# Patient Record
Sex: Female | Born: 1935 | Race: White | Hispanic: No | Marital: Married | Smoking: Never smoker
Health system: Southern US, Community
[De-identification: ages and names within clinical notes are randomized; demographics above are authoritative.]

## PROBLEM LIST (undated history)

## (undated) DIAGNOSIS — E785 Hyperlipidemia, unspecified: Secondary | ICD-10-CM

## (undated) DIAGNOSIS — I10 Essential (primary) hypertension: Secondary | ICD-10-CM

## (undated) DIAGNOSIS — K219 Gastro-esophageal reflux disease without esophagitis: Secondary | ICD-10-CM

## (undated) DIAGNOSIS — Z8489 Family history of other specified conditions: Secondary | ICD-10-CM

## (undated) HISTORY — PX: CARDIAC ELECTROPHYSIOLOGY MAPPING AND ABLATION: SHX1292

## (undated) HISTORY — PX: BLADDER SURGERY: SHX569

## (undated) HISTORY — PX: ABDOMINAL HYSTERECTOMY: SHX81

---

## 2008-07-27 ENCOUNTER — Ambulatory Visit: Payer: Self-pay | Admitting: Diagnostic Radiology

## 2008-07-27 ENCOUNTER — Emergency Department (HOSPITAL_BASED_OUTPATIENT_CLINIC_OR_DEPARTMENT_OTHER): Admission: EM | Admit: 2008-07-27 | Discharge: 2008-07-27 | Payer: Self-pay | Admitting: Emergency Medicine

## 2010-05-23 IMAGING — CR DG ANKLE COMPLETE 3+V*R*
3 series · 3 of 3 positions shown · non-contrast
Comparison: None

CLINICAL DATA: Fell with pain

RIGHT ANKLE - COMPLETE 3+ VIEW

[t ankle joint ap right]
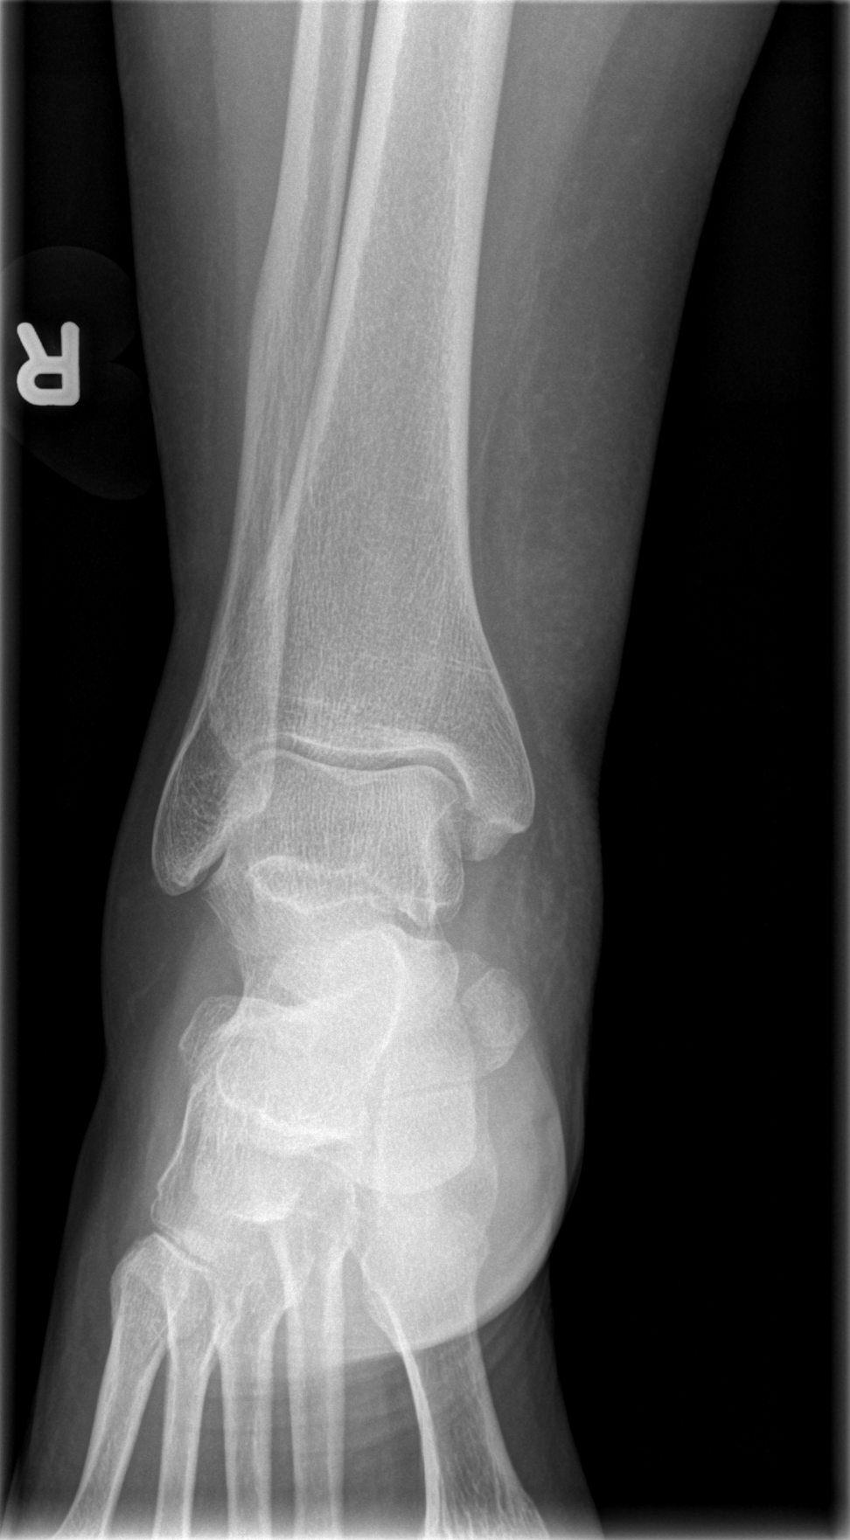

[t ankle joint oblique right]
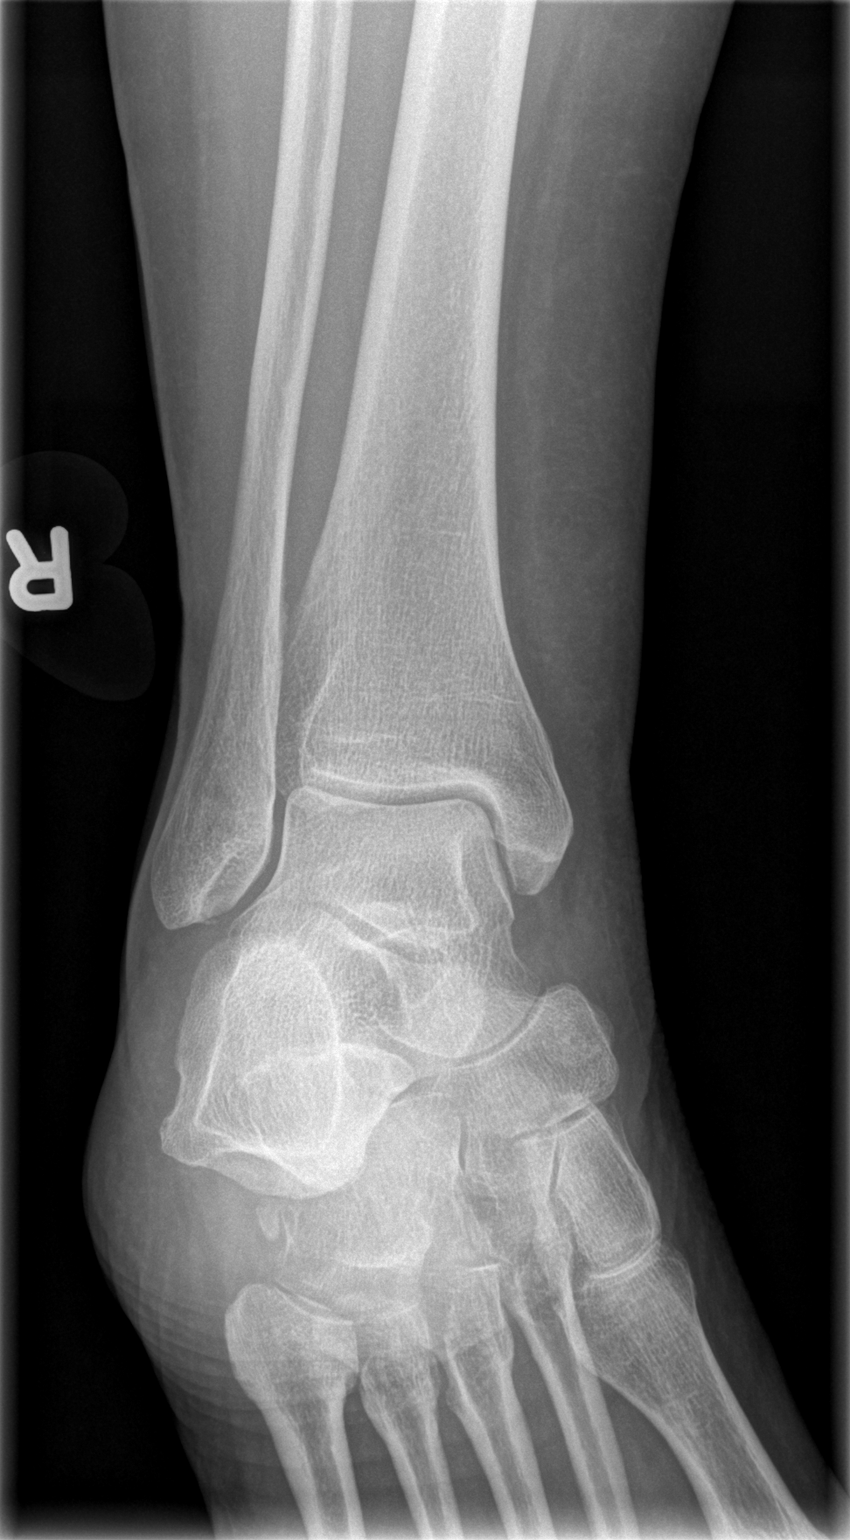

[t ankle joint lat right]
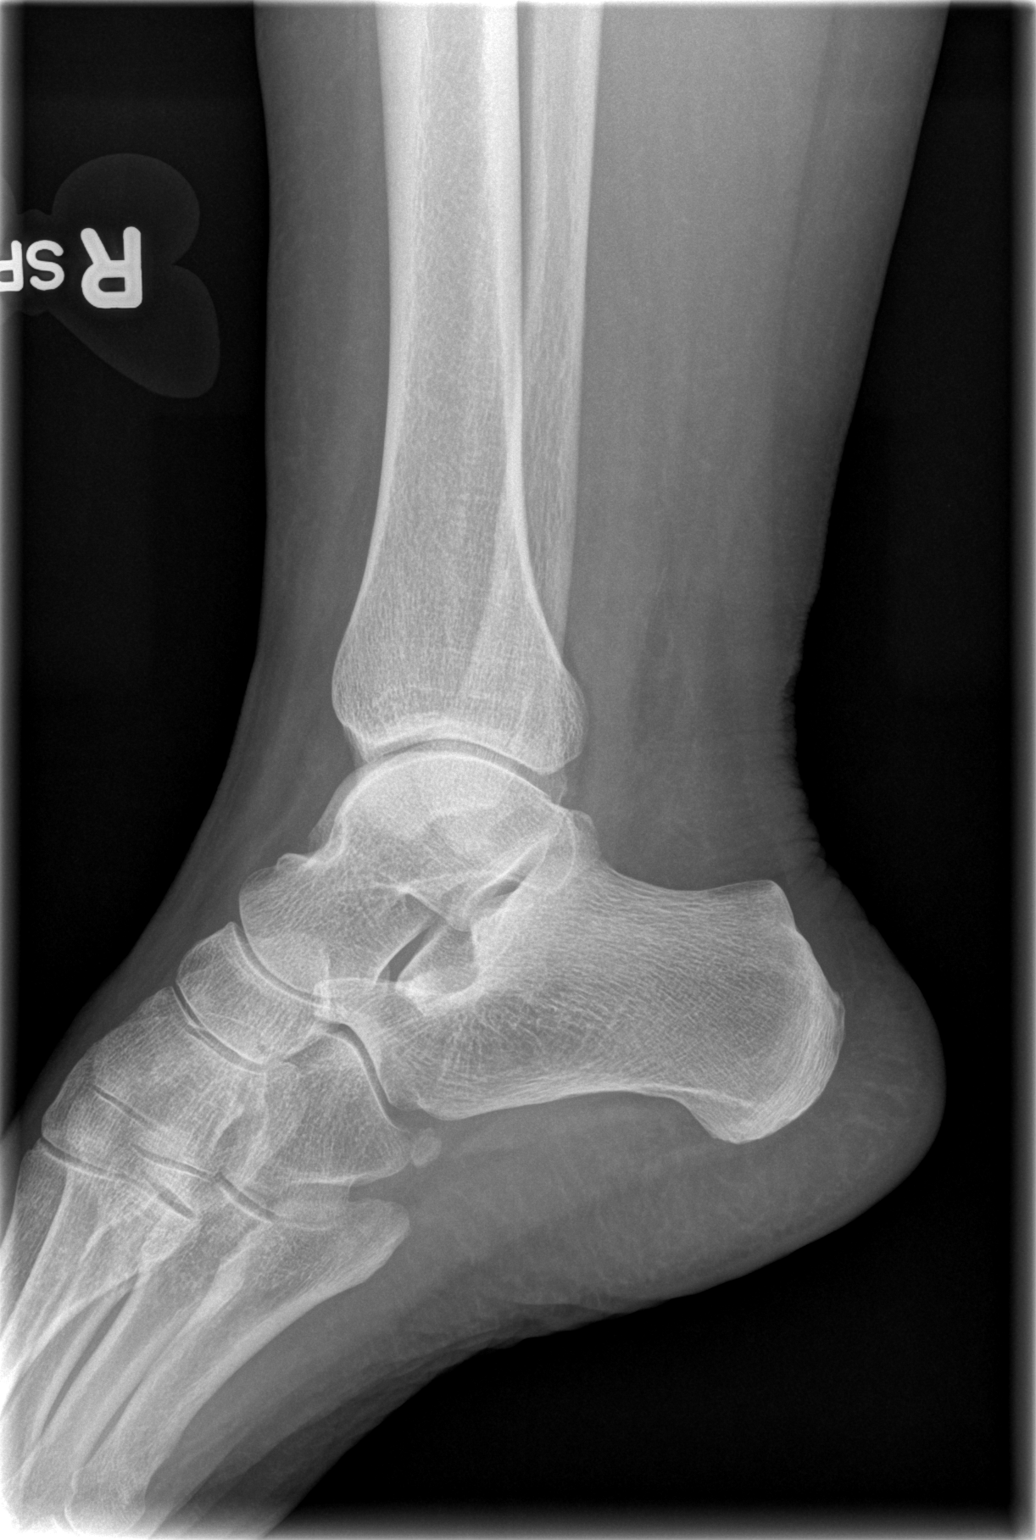

[3 of 3 positions shown; findings below may reference images not displayed]

FINDINGS: No acute fracture is seen.  The ankle joint appears
normal.  Alignment is normal.
IMPRESSION: No acute fracture.

## 2010-07-30 ENCOUNTER — Emergency Department (HOSPITAL_BASED_OUTPATIENT_CLINIC_OR_DEPARTMENT_OTHER)
Admission: EM | Admit: 2010-07-30 | Discharge: 2010-07-30 | Disposition: A | Discharge status: Home / Self Care | Payer: Medicare Other | Attending: Emergency Medicine | Admitting: Emergency Medicine

## 2010-07-30 DIAGNOSIS — I10 Essential (primary) hypertension: Secondary | ICD-10-CM | POA: Insufficient documentation

## 2010-07-30 DIAGNOSIS — E7800 Pure hypercholesterolemia, unspecified: Secondary | ICD-10-CM | POA: Insufficient documentation

## 2010-07-30 LAB — BASIC METABOLIC PANEL
CO2: 27 mEq/L (ref 19–32)
Calcium: 9.9 mg/dL (ref 8.4–10.5)
Creatinine, Ser: 0.9 mg/dL (ref 0.4–1.2)
GFR calc non Af Amer: >60 mL/min (ref >60)
Glucose, Bld: 108 mg/dL — ABNORMAL HIGH (ref 70–99)

## 2011-03-03 ENCOUNTER — Encounter: Payer: Self-pay | Admitting: *Deleted

## 2011-03-03 ENCOUNTER — Emergency Department (HOSPITAL_BASED_OUTPATIENT_CLINIC_OR_DEPARTMENT_OTHER)
Admission: EM | Admit: 2011-03-03 | Discharge: 2011-03-03 | Disposition: A | Discharge status: Home / Self Care | Payer: Medicare Other | Attending: Emergency Medicine | Admitting: Emergency Medicine

## 2011-03-03 DIAGNOSIS — L03319 Cellulitis of trunk, unspecified: Secondary | ICD-10-CM | POA: Insufficient documentation

## 2011-03-03 DIAGNOSIS — E785 Hyperlipidemia, unspecified: Secondary | ICD-10-CM | POA: Insufficient documentation

## 2011-03-03 DIAGNOSIS — L723 Sebaceous cyst: Secondary | ICD-10-CM

## 2011-03-03 DIAGNOSIS — I10 Essential (primary) hypertension: Secondary | ICD-10-CM | POA: Insufficient documentation

## 2011-03-03 DIAGNOSIS — L0291 Cutaneous abscess, unspecified: Secondary | ICD-10-CM

## 2011-03-03 DIAGNOSIS — L02219 Cutaneous abscess of trunk, unspecified: Secondary | ICD-10-CM | POA: Insufficient documentation

## 2011-03-03 HISTORY — DX: Essential (primary) hypertension: I10

## 2011-03-03 HISTORY — DX: Hyperlipidemia, unspecified: E78.5

## 2011-03-03 MED ORDER — LIDOCAINE-EPINEPHRINE 2 %-1:100000 IJ SOLN
20.0000 mL | Freq: Once | INTRAMUSCULAR | Status: AC
  Administered 2011-03-03: 1 mL
  Filled 2011-03-03: qty 1

## 2011-03-03 NOTE — ED Notes (Signed)
Pt c/o abscess in right inguinal area.

## 2011-03-03 NOTE — ED Provider Notes (Signed)
History     CSN: [id number] Arrival date & time: 03/03/2011  3:02 PM   First MD Initiated Contact with Patient 03/03/11 1518      Chief Complaint  Patient presents with  . Abscess    (Consider location/radiation/quality/duration/timing/severity/associated sxs/prior treatment) Patient is a 76 y.o. female presenting with abscess. The history is provided by the patient.  Abscess  This is a new problem. The current episode started yesterday. The onset was gradual. The problem occurs continuously. The problem has been gradually worsening. The abscess is present on the groin. The problem is moderate. The abscess is characterized by redness and painfulness. Associated with: nothing. The abscess first occurred at home. Pertinent negatives include no fever. She has received no recent medical care.    Past Medical History  Diagnosis Date  . Hypertension   . Hyperlipidemia     Past Surgical History  Procedure Date  . Cardiac electrophysiology mapping and ablation   . Abdominal hysterectomy   . Bladder surgery     No family history on file.  History  Substance Use Topics  . Smoking status: Never Smoker   . Smokeless tobacco: Not on file  . Alcohol Use: Yes    OB History    Grav Para Term Preterm Abortions TAB SAB Ect Mult Living                  Review of Systems  Constitutional: Negative for fever.  All other systems reviewed and are negative.    Allergies  Guaifenesin & derivatives  Home Medications  No current outpatient prescriptions on file.  BP 152/67  Pulse 99  Temp(Src) 97.8 F (36.6 C) (Oral)  Resp 18  SpO2 99%  Physical Exam  Nursing note and vitals reviewed. Constitutional: She is oriented to person, place, and time. She appears well-developed and well-nourished. No distress.  HENT:  Head: Normocephalic and atraumatic.  Eyes: EOM are normal. Pupils are equal, round, and reactive to light.  Neck: Neck supple.  Abdominal: Soft.  Musculoskeletal:  She exhibits no tenderness.  Neurological: She is alert and oriented to person, place, and time.  Skin: Skin is warm and dry.       Small abscess in the right groin in the bikini area without surrounding erythema or drainage but pointing and fluctuant.    ED Course  Procedures (including critical care time)  Labs Reviewed - No data to display No results found.  INCISION AND DRAINAGE Performed by: Gwyneth Sprout Consent: Verbal consent obtained. Risks and benefits: risks, benefits and alternatives were discussed Type: abscess  Body area: right groin   Anesthesia: local infiltration  Local anesthetic: lidocaine 2% with epinephrine  Anesthetic total: 2 ml  Complexity: simple   Drainage: purulent  Drainage amount: 1mL   Patient tolerance: Patient tolerated the procedure well with no immediate complications.     No diagnosis found.    MDM   Pt with abscess in the right groin without surrounding infection.  No other signs of abnormalities and not diabetic.  Abscess drained and d/c home.        Gwyneth Sprout, MD 03/03/11 1614

## 2021-08-12 ENCOUNTER — Other Ambulatory Visit: Payer: Self-pay | Admitting: Orthopedic Surgery

## 2021-08-26 ENCOUNTER — Encounter (HOSPITAL_BASED_OUTPATIENT_CLINIC_OR_DEPARTMENT_OTHER): Payer: Self-pay | Admitting: Orthopedic Surgery

## 2021-08-26 ENCOUNTER — Other Ambulatory Visit: Payer: Self-pay

## 2021-09-01 ENCOUNTER — Encounter (HOSPITAL_BASED_OUTPATIENT_CLINIC_OR_DEPARTMENT_OTHER)
Admission: RE | Admit: 2021-09-01 | Discharge: 2021-09-01 | Disposition: A | Discharge status: Home / Self Care | Payer: Medicare Other | Source: Ambulatory Visit | Attending: Orthopedic Surgery | Admitting: Orthopedic Surgery

## 2021-09-01 DIAGNOSIS — Z01818 Encounter for other preprocedural examination: Secondary | ICD-10-CM | POA: Insufficient documentation

## 2021-09-01 DIAGNOSIS — I10 Essential (primary) hypertension: Secondary | ICD-10-CM | POA: Diagnosis not present

## 2021-09-01 LAB — BASIC METABOLIC PANEL
Anion gap: 9 (ref 5–15)
BUN: 13 mg/dL (ref 8–23)
CO2: 25 mmol/L (ref 22–32)
Calcium: 9.4 mg/dL (ref 8.9–10.3)
Chloride: 108 mmol/L (ref 98–111)
Creatinine, Ser: 0.84 mg/dL (ref 0.44–1.00)
GFR, Estimated: >60 mL/min (ref >60)
Glucose, Bld: 100 mg/dL — ABNORMAL HIGH (ref 70–99)
Potassium: 3.8 mmol/L (ref 3.5–5.1)
Sodium: 142 mmol/L (ref 135–145)

## 2021-09-01 NOTE — Progress Notes (Signed)

## 2021-09-04 NOTE — Anesthesia Preprocedure Evaluation (Addendum)
Anesthesia Evaluation  ?Patient identified by MRN, date of birth, ID band ?Patient awake ? ? ? ?Reviewed: ?Allergy & Precautions, NPO status , Patient's Chart, lab work & pertinent test results, reviewed documented beta blocker date and time  ? ?Airway ?Mallampati: III ? ?TM Distance: >3 FB ?Neck ROM: Full ? ? ? Dental ? ?(+) Teeth Intact, Dental Advisory Given ?  ?Pulmonary ?neg pulmonary ROS,  ?  ?Pulmonary exam normal ?breath sounds clear to auscultation ? ? ? ? ? ? Cardiovascular ?hypertension (161/93 in preop, normally SBP 140s per pt), Pt. on medications and Pt. on home beta blockers ?Normal cardiovascular exam ?Rhythm:Regular Rate:Normal ? ? ?  ?Neuro/Psych ?negative neurological ROS ? negative psych ROS  ? GI/Hepatic ?Neg liver ROS, GERD  Controlled,  ?Endo/Other  ?negative endocrine ROS ? Renal/GU ?negative Renal ROS  ?negative genitourinary ?  ?Musculoskeletal ?negative musculoskeletal ROS ?(+)  ? Abdominal ?  ?Peds ? Hematology ?negative hematology ROS ?(+)   ?Anesthesia Other Findings ? ? Reproductive/Obstetrics ?negative OB ROS ? ?  ? ? ? ? ? ? ? ? ? ? ? ? ? ?  ?  ? ? ? ? ? ? ? ?Anesthesia Physical ?Anesthesia Plan ? ?ASA: 2 ? ?Anesthesia Plan: Bier Block and MAC and Bier Block-LIDOCAINE ONLY  ? ?Post-op Pain Management: Tylenol PO (pre-op)*  ? ?Induction:  ? ?PONV Risk Score and Plan: 2 and Propofol infusion and TIVA ? ?Airway Management Planned: Natural Airway and Simple Face Mask ? ?Additional Equipment: None ? ?Intra-op Plan:  ? ?Post-operative Plan:  ? ?Informed Consent: I have reviewed the patients History and Physical, chart, labs and discussed the procedure including the risks, benefits and alternatives for the proposed anesthesia with the patient or authorized representative who has indicated his/her understanding and acceptance.  ? ? ? ?Dental advisory given ? ?Plan Discussed with: CRNA ? ?Anesthesia Plan Comments:   ? ? ? ? ? ?Anesthesia Quick Evaluation ? ?

## 2021-09-05 ENCOUNTER — Encounter (HOSPITAL_BASED_OUTPATIENT_CLINIC_OR_DEPARTMENT_OTHER)
Admission: RE | Disposition: A | Discharge status: Home / Self Care | Payer: Self-pay | Source: Ambulatory Visit | Attending: Orthopedic Surgery

## 2021-09-05 ENCOUNTER — Ambulatory Visit (HOSPITAL_BASED_OUTPATIENT_CLINIC_OR_DEPARTMENT_OTHER): Payer: Medicare Other | Admitting: Anesthesiology

## 2021-09-05 ENCOUNTER — Encounter (HOSPITAL_BASED_OUTPATIENT_CLINIC_OR_DEPARTMENT_OTHER): Payer: Self-pay | Admitting: Orthopedic Surgery

## 2021-09-05 ENCOUNTER — Ambulatory Visit (HOSPITAL_BASED_OUTPATIENT_CLINIC_OR_DEPARTMENT_OTHER)
Admission: RE | Admit: 2021-09-05 | Discharge: 2021-09-05 | Disposition: A | Discharge status: Home / Self Care | Payer: Medicare Other | Source: Ambulatory Visit | Attending: Orthopedic Surgery | Admitting: Orthopedic Surgery

## 2021-09-05 ENCOUNTER — Other Ambulatory Visit: Payer: Self-pay

## 2021-09-05 DIAGNOSIS — I10 Essential (primary) hypertension: Secondary | ICD-10-CM | POA: Diagnosis not present

## 2021-09-05 DIAGNOSIS — M25841 Other specified joint disorders, right hand: Secondary | ICD-10-CM | POA: Insufficient documentation

## 2021-09-05 DIAGNOSIS — M25741 Osteophyte, right hand: Secondary | ICD-10-CM | POA: Insufficient documentation

## 2021-09-05 DIAGNOSIS — L729 Follicular cyst of the skin and subcutaneous tissue, unspecified: Secondary | ICD-10-CM

## 2021-09-05 DIAGNOSIS — M189 Osteoarthritis of first carpometacarpal joint, unspecified: Secondary | ICD-10-CM

## 2021-09-05 DIAGNOSIS — M19041 Primary osteoarthritis, right hand: Secondary | ICD-10-CM | POA: Diagnosis not present

## 2021-09-05 DIAGNOSIS — M67441 Ganglion, right hand: Secondary | ICD-10-CM | POA: Diagnosis not present

## 2021-09-05 DIAGNOSIS — K219 Gastro-esophageal reflux disease without esophagitis: Secondary | ICD-10-CM | POA: Insufficient documentation

## 2021-09-05 HISTORY — PX: ROTATION FLAP: SHX6211

## 2021-09-05 HISTORY — PX: CYST EXCISION: SHX5701

## 2021-09-05 HISTORY — DX: Gastro-esophageal reflux disease without esophagitis: K21.9

## 2021-09-05 HISTORY — DX: Family history of other specified conditions: Z84.89

## 2021-09-05 SURGERY — CYST REMOVAL
Anesthesia: Monitor Anesthesia Care | Site: Thumb | Laterality: Right

## 2021-09-05 MED ORDER — FENTANYL CITRATE (PF) 100 MCG/2ML IJ SOLN
INTRAMUSCULAR | Status: DC | PRN
  Administered 2021-09-05 (×2): 25 ug via INTRAVENOUS

## 2021-09-05 MED ORDER — CEFAZOLIN SODIUM-DEXTROSE 2-4 GM/100ML-% IV SOLN
INTRAVENOUS | Status: AC
  Filled 2021-09-05: qty 100

## 2021-09-05 MED ORDER — ACETAMINOPHEN 500 MG PO TABS
ORAL_TABLET | ORAL | Status: AC
  Filled 2021-09-05: qty 2

## 2021-09-05 MED ORDER — PROPOFOL 500 MG/50ML IV EMUL
INTRAVENOUS | Status: DC | PRN
Start: 2021-09-05 — End: 2021-09-05
  Administered 2021-09-05: 50 ug/kg/min via INTRAVENOUS

## 2021-09-05 MED ORDER — CEFAZOLIN SODIUM-DEXTROSE 2-4 GM/100ML-% IV SOLN
2.0000 g | INTRAVENOUS | Status: AC
  Administered 2021-09-05: 2 g via INTRAVENOUS

## 2021-09-05 MED ORDER — ACETAMINOPHEN 500 MG PO TABS
1000.0000 mg | ORAL_TABLET | Freq: Once | ORAL | Status: AC
  Administered 2021-09-05: 1000 mg via ORAL

## 2021-09-05 MED ORDER — AMISULPRIDE (ANTIEMETIC) 5 MG/2ML IV SOLN: 10.0000 mg | Freq: Once | INTRAVENOUS | Status: DC | PRN

## 2021-09-05 MED ORDER — LIDOCAINE HCL (PF) 0.5 % IJ SOLN
INTRAMUSCULAR | Status: DC | PRN
  Administered 2021-09-05: 30 mL via INTRAVENOUS

## 2021-09-05 MED ORDER — BUPIVACAINE HCL (PF) 0.25 % IJ SOLN
INTRAMUSCULAR | Status: DC | PRN
  Administered 2021-09-05: 9 mL

## 2021-09-05 MED ORDER — FENTANYL CITRATE (PF) 100 MCG/2ML IJ SOLN
INTRAMUSCULAR | Status: AC
  Filled 2021-09-05: qty 2

## 2021-09-05 MED ORDER — ONDANSETRON HCL 4 MG/2ML IJ SOLN
INTRAMUSCULAR | Status: DC | PRN
  Administered 2021-09-05: 4 mg via INTRAVENOUS

## 2021-09-05 MED ORDER — ONDANSETRON HCL 4 MG/2ML IJ SOLN: 4.0000 mg | Freq: Once | INTRAMUSCULAR | Status: DC | PRN

## 2021-09-05 MED ORDER — 0.9 % SODIUM CHLORIDE (POUR BTL) OPTIME
TOPICAL | Status: DC | PRN
  Administered 2021-09-05: 80 mL

## 2021-09-05 MED ORDER — LACTATED RINGERS IV SOLN: INTRAVENOUS | Status: DC

## 2021-09-05 SURGICAL SUPPLY — 56 items
APL PRP STRL LF DISP 70% ISPRP (MISCELLANEOUS) ×1
APL SKNCLS STERI-STRIP NONHPOA (GAUZE/BANDAGES/DRESSINGS)
BENZOIN TINCTURE PRP APPL 2/3 (GAUZE/BANDAGES/DRESSINGS) IMPLANT
BLADE MINI RND TIP GREEN BEAV (BLADE) IMPLANT
BLADE SURG 15 STRL LF DISP TIS (BLADE) ×2 IMPLANT
BLADE SURG 15 STRL SS (BLADE) ×4
BNDG CMPR 5X2 CHSV 1 LYR STRL (GAUZE/BANDAGES/DRESSINGS)
BNDG CMPR 9X4 STRL LF SNTH (GAUZE/BANDAGES/DRESSINGS) ×1
BNDG COHESIVE 1X5 TAN STRL LF (GAUZE/BANDAGES/DRESSINGS) ×1 IMPLANT
BNDG COHESIVE 2X5 TAN ST LF (GAUZE/BANDAGES/DRESSINGS) IMPLANT
BNDG CONFORM 2 STRL LF (GAUZE/BANDAGES/DRESSINGS) IMPLANT
BNDG ELASTIC 2X5.8 VLCR STR LF (GAUZE/BANDAGES/DRESSINGS) IMPLANT
BNDG ELASTIC 3X5.8 VLCR STR LF (GAUZE/BANDAGES/DRESSINGS) IMPLANT
BNDG ESMARK 4X9 LF (GAUZE/BANDAGES/DRESSINGS) ×1 IMPLANT
BNDG GAUZE 1X2.1 STRL (MISCELLANEOUS) IMPLANT
BNDG GAUZE ELAST 4 BULKY (GAUZE/BANDAGES/DRESSINGS) IMPLANT
BNDG PLASTER X FAST 3X3 WHT LF (CAST SUPPLIES) IMPLANT
BNDG PLSTR 9X3 FST ST WHT (CAST SUPPLIES)
CHLORAPREP W/TINT 26 (MISCELLANEOUS) ×2 IMPLANT
CORD BIPOLAR FORCEPS 12FT (ELECTRODE) ×2 IMPLANT
COVER BACK TABLE 60X90IN (DRAPES) ×2 IMPLANT
COVER MAYO STAND STRL (DRAPES) ×2 IMPLANT
CUFF TOURN SGL QUICK 18X4 (TOURNIQUET CUFF) ×1 IMPLANT
DRAPE EXTREMITY T 121X128X90 (DISPOSABLE) ×2 IMPLANT
DRAPE SURG 17X23 STRL (DRAPES) ×1 IMPLANT
GAUZE 4X4 16PLY ~~LOC~~+RFID DBL (SPONGE) ×1 IMPLANT
GAUZE SPONGE 4X4 12PLY STRL (GAUZE/BANDAGES/DRESSINGS) ×2 IMPLANT
GAUZE XEROFORM 1X8 LF (GAUZE/BANDAGES/DRESSINGS) ×2 IMPLANT
GLOVE BIO SURGEON STRL SZ7 (GLOVE) ×1 IMPLANT
GLOVE BIO SURGEON STRL SZ7.5 (GLOVE) ×2 IMPLANT
GLOVE BIOGEL PI IND STRL 7.0 (GLOVE) IMPLANT
GLOVE BIOGEL PI IND STRL 8 (GLOVE) ×1 IMPLANT
GLOVE BIOGEL PI INDICATOR 7.0 (GLOVE) ×1
GLOVE BIOGEL PI INDICATOR 8 (GLOVE) ×1
GOWN STRL REUS W/ TWL LRG LVL3 (GOWN DISPOSABLE) ×1 IMPLANT
GOWN STRL REUS W/TWL LRG LVL3 (GOWN DISPOSABLE) ×2
GOWN STRL REUS W/TWL XL LVL3 (GOWN DISPOSABLE) ×1 IMPLANT
NDL HYPO 25X1 1.5 SAFETY (NEEDLE) ×1 IMPLANT
NEEDLE HYPO 25X1 1.5 SAFETY (NEEDLE) ×2 IMPLANT
NS IRRIG 1000ML POUR BTL (IV SOLUTION) ×2 IMPLANT
PACK BASIN DAY SURGERY FS (CUSTOM PROCEDURE TRAY) ×2 IMPLANT
PAD CAST 3X4 CTTN HI CHSV (CAST SUPPLIES) IMPLANT
PAD CAST 4YDX4 CTTN HI CHSV (CAST SUPPLIES) IMPLANT
PADDING CAST ABS 4INX4YD NS (CAST SUPPLIES)
PADDING CAST ABS COTTON 4X4 ST (CAST SUPPLIES) ×1 IMPLANT
PADDING CAST COTTON 3X4 STRL (CAST SUPPLIES)
PADDING CAST COTTON 4X4 STRL (CAST SUPPLIES)
SPLINT FINGER 3.25 911903 (SOFTGOODS) ×1 IMPLANT
STOCKINETTE 4X48 STRL (DRAPES) ×2 IMPLANT
STRIP CLOSURE SKIN 1/2X4 (GAUZE/BANDAGES/DRESSINGS) IMPLANT
SUT ETHILON 3 0 PS 1 (SUTURE) IMPLANT
SUT ETHILON 4 0 PS 2 18 (SUTURE) ×2 IMPLANT
SYR BULB EAR ULCER 3OZ GRN STR (SYRINGE) ×2 IMPLANT
SYR CONTROL 10ML LL (SYRINGE) ×2 IMPLANT
TOWEL GREEN STERILE FF (TOWEL DISPOSABLE) ×3 IMPLANT
UNDERPAD 30X36 HEAVY ABSORB (UNDERPADS AND DIAPERS) ×2 IMPLANT

## 2021-09-05 NOTE — Discharge Instructions (Addendum)
[facility] Instructions ?Hand Surgery ? ?Wound Care: ?Keep your hand elevated above the level of your heart.  Do not allow it to dangle by your side.  Keep the dressing dry and do not remove it unless your doctor advises you to do so.  He will usually change it at the time of your post-op visit.  Moving your fingers is advised to stimulate circulation but will depend on the site of your surgery.  If you have a splint applied, your doctor will advise you regarding movement. ? ?Activity: ?Do not drive or operate machinery today.  Rest today and then you may return to your normal activity and work as indicated by your physician. ? ?Diet:  ?Drink liquids today or eat a light diet.  You may resume a regular diet tomorrow.   ? ?General expectations: ?Pain for two to three days. ?Fingers may become slightly swollen. ? ?Call your doctor if any of the following occur: ?Severe pain not relieved by pain medication. ?Elevated temperature. ?Dressing soaked with blood. ?Inability to move fingers. ?White or bluish color to fingers. ? ?No Tylenol before 6:30pm today, if needed. ? ? ?Post Anesthesia Home Care Instructions ? ?Activity: ?Get plenty of rest for the remainder of the day. A responsible individual must stay with you for 24 hours following the procedure.  ?For the next 24 hours, DO NOT: ?-Drive a car ?-Advertising copywriter ?-Drink alcoholic beverages ?-Take any medication unless instructed by your physician ?-Make any legal decisions or sign important papers. ? ?Meals: ?Start with liquid foods such as gelatin or soup. Progress to regular foods as tolerated. Avoid greasy, spicy, heavy foods. If nausea and/or vomiting occur, drink only clear liquids until the nausea and/or vomiting subsides. Call your physician if vomiting continues. ? ?Special Instructions/Symptoms: ?Your throat may feel dry or sore from the anesthesia or the breathing tube placed in your throat during surgery. If this causes discomfort, gargle with warm salt  water. The discomfort should disappear within 24 hours. ? ?If you had a scopolamine patch placed behind your ear for the management of post- operative nausea and/or vomiting: ? ?1. The medication in the patch is effective for 72 hours, after which it should be removed.  Wrap patch in a tissue and discard in the trash. Wash hands thoroughly with soap and water. ?2. You may remove the patch earlier than 72 hours if you experience unpleasant side effects which may include dry mouth, dizziness or visual disturbances. ?3. Avoid touching the patch. Wash your hands with soap and water after contact with the patch. ?    ?

## 2021-09-05 NOTE — Anesthesia Postprocedure Evaluation (Signed)
Anesthesia Post Note ? ?Patient: Jasmin Kelley ? ?Procedure(s) Performed: excision mucoid cyst with interphalangeal joint arthrotomy right thumb (Right: Thumb) ?ROTATION FLAP (Right: Thumb) ? ?  ? ?Patient location during evaluation: PACU ?Anesthesia Type: MAC and Bier Block ?Level of consciousness: awake and alert ?Pain management: pain level controlled ?Vital Signs Assessment: post-procedure vital signs reviewed and stable ?Respiratory status: spontaneous breathing, nonlabored ventilation and respiratory function stable ?Cardiovascular status: blood pressure returned to baseline and stable ?Postop Assessment: no apparent nausea or vomiting ?Anesthetic complications: no ? ? ?No notable events documented. ? ?Last Vitals:  ?Vitals:  ? 09/05/21 1225 09/05/21 1502  ?BP: (!) 161/93 131/72  ?Pulse: (!) 107 86  ?Resp: 16 14  ?Temp: 36.8 ?C (!) 36.1 ?C  ?SpO2: 97% 92%  ?  ?Last Pain:  ?Vitals:  ? 09/05/21 1502  ?TempSrc:   ?PainSc: 0-No pain  ? ? ?  ?  ?  ?  ?  ?  ? ?Jasmin Kelley ? ? ? ? ?

## 2021-09-05 NOTE — Op Note (Signed)
NAME: Jasmin Kelley ?MEDICAL RECORD NO: [id number] ?DATE OF BIRTH: [birth year] ?FACILITY: [facility] ?LOCATION: [facility] ?PHYSICIAN: Tami Ribas, MD ?  ?OPERATIVE REPORT ?  ?DATE OF PROCEDURE: 09/05/21  ?  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  ?PROCEDURE: ?1.  Right thumb excision of mucoid cyst ?2.  Right thumb debridement of IP joint including removal of osteophyte from proximal phalanx ?  ?SURGEON:  Betha Loa, M.D. ?  ?ASSISTANT: none ?  ?ANESTHESIA:  Bier block with sedation ?  ?INTRAVENOUS FLUIDS:  Per anesthesia flow sheet. ?  ?ESTIMATED BLOOD LOSS:  Minimal. ?  ?COMPLICATIONS:  None. ?  ?SPECIMENS: Right thumb cyst to pathology ?  ?TOURNIQUET TIME:   ? ?Total Tourniquet Time Documented: ?Forearm (Right) - 27 minutes ?Total: Forearm (Right) - 27 minutes ? ?  ?DISPOSITION:  Stable to PACU. ?  ?INDICATIONS: 87 year old female with mucoid cyst of right thumb.  This is bothersome to her.  She wishes to have it removed and the IP joint debrided to try to prevent recurrence.  Risks, benefits and alternatives of surgery were discussed including the risks of blood loss, infection, damage to nerves, vessels, tendons, ligaments, bone for surgery, need for additional surgery, complications with wound healing, continued pain, stiffness, , recurrence.  She voiced understanding of these risks and elected to proceed. ? ?OPERATIVE COURSE:  After being identified preoperatively by myself,  the patient and I agreed on the procedure and site of the procedure.  The surgical site was marked.  Surgical consent had been signed. Preoperative IV antibiotic prophylaxis was given. She was transferred to the operating room and placed on the operating table in supine position with the right upper extremity on an arm board.  Bier block anesthesia was induced by the anesthesiologist. Right upper extremity was  prepped and draped in normal sterile orthopedic fashion.  A surgical pause was performed between the surgeons, anesthesia, and operating room staff and all were in agreement as to the patient, procedure, and site of procedure.  Tourniquet at the proximal aspect of the forearm had been inflated for the Bier block.  A hockey-stick shaped incision was made at the IP joint of the thumb.  This is carried in subcutaneous tissues by spreading technique.  The cyst was identified.  Was filled with clear gelatinous fluid.  The cyst was removed using the pickups and synovectomy rongeurs.  The IP joint was entered underneath the extensor tendon.  There was more cystic material underneath the tendon.  This was removed as well.  A synovectomy was performed.  There is a large osteophyte at the dorsum of the proximal phalanx.  This was debrided using the rongeurs.  The wound and joint were copiously irrigated with sterile saline.  The wound was closed with 4-0 nylon in a horizontal mattress fashion.  It was dressed with sterile Xeroform 4 x 4 and wrapped with a Coban dressing lightly.  An AlumaFoam splint was placed and wrapped lightly with Coban dressing.  A digital block was performed with quarter percent plain Marcaine to aid in postoperative analgesia.  The tourniquet was deflated at 27 minutes.  Fingertips were pink with brisk capillary refill after deflation of tourniquet.  The operative  drapes were broken down.  The patient was awoken from anesthesia safely.  She was transferred back to the stretcher and taken to PACU in stable condition.  I will see  her back in the office in 1 week for postoperative followup.  She has a prescription for hydrocodone 5/325 1-2 p.o. every 6 hours as needed pain given last week. ? ? ?Betha Loa, MD ?Electronically signed, 09/05/21 ?

## 2021-09-05 NOTE — Anesthesia Procedure Notes (Signed)
Anesthesia Regional Block: Bier block (IV Regional)  ? ?Pre-Anesthetic Checklist: , timeout performed,  Correct Patient, Correct Site, Correct Laterality,  Correct Procedure,, site marked,  Surgical consent,  At surgeon's request ? ?Laterality: Right and Lower ? ?     ?  ?Needles:  ?Injection technique: Single-shot ? ?Needle Type: Other   ? ? ? ?Needle Gauge: 22  ? ? ? ?Additional Needles: ? ? ?Procedures:,,,,, intact distal pulses, Esmarch exsanguination,  Single tourniquet utilized    ?Narrative:  ?Start time: 09/05/2021 2:30 PM ?End time: 09/05/2021 2:30 PM ? ?Performed by: Personally  ? ? ? ?

## 2021-09-05 NOTE — Transfer of Care (Signed)
Immediate Anesthesia Transfer of Care Note ? ?Patient: Jasmin Kelley ? ?Procedure(s) Performed: excision mucoid cyst with interphalangeal joint arthrotomy right thumb (Right: Thumb) ?ROTATION FLAP (Right: Thumb) ? ?Patient Location: PACU ? ?Anesthesia Type:MAC and Bier block ? ?Level of Consciousness: awake, alert  and oriented ? ?Airway & Oxygen Therapy: Patient Spontanous Breathing ? ?Post-op Assessment: Report given to RN and Post -op Vital signs reviewed and stable ? ?Post vital signs: Reviewed and stable ? ?Last Vitals:  ?Vitals Value Taken Time  ?BP 131/72 09/05/21 1502  ?Temp    ?Pulse 90 09/05/21 1505  ?Resp 20 09/05/21 1505  ?SpO2 91 % 09/05/21 1505  ?Vitals shown include unvalidated device data. ? ?Last Pain:  ?Vitals:  ? 09/05/21 1225  ?TempSrc: Oral  ?PainSc: 0-No pain  ?   ? ?  ? ?Complications: No notable events documented. ?

## 2021-09-05 NOTE — H&P (Signed)
?  Jasmin Kelley is an 87 y.o. female.   ?Chief Complaint: right thumb cyst ?HPI: 87 yo female with right thumb cyst x 2 months.  It is bothersome to her.  She wishes to have it removed and the ip joint debrided to try to prevent recurrence. ? ?Allergies:  ?Allergies  ?Allergen Reactions  ? Guaifenesin & Derivatives Anaphylaxis  ? ? ?Past Medical History:  ?Diagnosis Date  ? Family history of adverse reaction to anesthesia   ? son with history of n/v  ? GERD (gastroesophageal reflux disease)   ? Hyperlipidemia   ? Hypertension   ? ? ?Past Surgical History:  ?Procedure Laterality Date  ? ABDOMINAL HYSTERECTOMY    ? BLADDER SURGERY    ? CARDIAC ELECTROPHYSIOLOGY MAPPING AND ABLATION    ? ? ?Family History: ?History reviewed. No pertinent family history. ? ?Social History:  ? reports that she has never smoked. She does not have any smokeless tobacco history on file. She reports current alcohol use. She reports that she does not use drugs. ? ?Medications: ?Medications Prior to Admission  ?Medication Sig Dispense Refill  ? amLODIPine Besylate (NORVASC PO) Take by mouth.    ? Cholecalciferol (D3 PO) Take by mouth.    ? Cyanocobalamin (B-12 PO) Take by mouth.    ? Famotidine (PEPCID PO) Take by mouth.    ? Flaxseed, Linseed, (FLAXSEED OIL PO) Take by mouth.    ? Furosemide (LASIX PO) Take by mouth.    ? MAGNESIUM PO Take by mouth.    ? Metoprolol Tartrate (FIRST - METOPROLOL PO) Take by mouth.    ? Cetirizine HCl (ZYRTEC PO) Take by mouth.    ? ? ?No results found for this or any previous visit (from the past 48 hour(s)). ? ?No results found. ? ? ? ?Blood pressure (!) 161/93, pulse (!) 107, temperature 98.2 ?F (36.8 ?C), temperature source Oral, resp. rate 16, height 5\' 1"  (1.549 m), weight 73.5 kg, SpO2 97 %. ? ?General appearance: alert, cooperative, and appears stated age ?Head: Normocephalic, without obvious abnormality, atraumatic ?Neck: supple, symmetrical, trachea midline ?Extremities: Intact sensation and capillary  refill all digits.  +epl/fpl/io.  No wounds.  ?Pulses: 2+ and symmetric ?Skin: Skin color, texture, turgor normal. No rashes or lesions ?Neurologic: Grossly normal ?Incision/Wound: none ? ?Assessment/Plan ?Right thumb mucoid cyst and ip joint arthritis.  Non operative and operative treatment options have been discussed with the patient and patient wishes to proceed with operative treatment. Risks, benefits, and alternatives of surgery have been discussed and the patient agrees with the plan of care.  ? ? ?09/05/2021, 1:12 PM ? ?

## 2021-09-06 ENCOUNTER — Encounter (HOSPITAL_BASED_OUTPATIENT_CLINIC_OR_DEPARTMENT_OTHER): Payer: Self-pay | Admitting: Orthopedic Surgery

## 2021-09-07 LAB — SURGICAL PATHOLOGY

## 2021-12-06 ENCOUNTER — Other Ambulatory Visit: Payer: Self-pay

## 2021-12-06 ENCOUNTER — Emergency Department (HOSPITAL_BASED_OUTPATIENT_CLINIC_OR_DEPARTMENT_OTHER)
Admission: EM | Admit: 2021-12-06 | Discharge: 2021-12-06 | Disposition: A | Discharge status: Home / Self Care | Payer: Medicare Other | Attending: Emergency Medicine | Admitting: Emergency Medicine

## 2021-12-06 ENCOUNTER — Encounter (HOSPITAL_BASED_OUTPATIENT_CLINIC_OR_DEPARTMENT_OTHER): Payer: Self-pay | Admitting: Emergency Medicine

## 2021-12-06 DIAGNOSIS — Z79899 Other long term (current) drug therapy: Secondary | ICD-10-CM | POA: Insufficient documentation

## 2021-12-06 DIAGNOSIS — R040 Epistaxis: Secondary | ICD-10-CM | POA: Diagnosis present

## 2021-12-06 MED ORDER — PHENYLEPHRINE HCL 0.5 % NA SOLN
1.0000 [drp] | Freq: Once | NASAL | Status: AC
  Administered 2021-12-06: 1 [drp] via NASAL
  Filled 2021-12-06: qty 15

## 2021-12-06 NOTE — ED Notes (Signed)
[facility] will transport patient back to [facility]

## 2021-12-06 NOTE — ED Provider Notes (Signed)
[facility] EMERGENCY DEPARTMENT Provider Note   CSN: [id number] Arrival date & time: 12/06/21  2841     History  Chief Complaint  Patient presents with   Epistaxis    Jasmin Kelley is a 87 y.o. female.  HPI Patient awakened this morning to a wet feeling on her pillow.  She was sleeping face down.  Turned on the light and noted that she was having nasal bleeding from the left nare.  Reports it was very brisk.  She applied ice pack and pressure.  The time EMS arrived, she reports the bleeding has significantly slowed down and nearly stopped.  Patient has been using nasal sprays and decongestants for recent sinus congestion over the past several weeks.  She has never had prior nasal bleeding.  No anticoagulants.    Home Medications Prior to Admission medications   Medication Sig Start Date End Date Taking? Authorizing Provider  amLODIPine Besylate (NORVASC PO) Take by mouth.    [provider]  Cetirizine HCl (ZYRTEC PO) Take by mouth.    [provider]  Cholecalciferol (D3 PO) Take by mouth.    [provider]  Cyanocobalamin (B-12 PO) Take by mouth.    [provider]  Famotidine (PEPCID PO) Take by mouth.    [provider]  Flaxseed, Linseed, (FLAXSEED OIL PO) Take by mouth.    [provider]  Furosemide (LASIX PO) Take by mouth.    [provider]  MAGNESIUM PO Take by mouth.    [provider]  Metoprolol Tartrate (FIRST - METOPROLOL PO) Take by mouth.    [provider]      Allergies    Guaifenesin & derivatives    Review of Systems   Review of Systems Contusional: No fever no chills no malaise Neurologic: No headache no confusion no visual change Physical Exam Updated Vital Signs BP (!) 147/86   Pulse 100   Resp 18   Ht 5\' 1"  (1.549 m)   Wt 73.5 kg   SpO2 99%   BMI 30.62 kg/m  Physical Exam Constitutional:      Comments: Alert nontoxic well in appearance.  HENT:      Head: Normocephalic and atraumatic.     Nose:     Comments: Bilateral nares are patent.  There is no clot present in the left nare.  Trace of red blood on the nasal septum.    Mouth/Throat:     Mouth: Mucous membranes are moist.     Pharynx: Oropharynx is clear.     Comments: Posterior oropharynx, scant fresh blood streak clear mucus.  No clot present. Cardiovascular:     Rate and Rhythm: Normal rate and regular rhythm.  Pulmonary:     Effort: Pulmonary effort is normal.     Breath sounds: Normal breath sounds.  Skin:    General: Skin is warm and dry.  Neurological:     General: No focal deficit present.     Mental Status: She is oriented to person, place, and time.     Coordination: Coordination normal.  Psychiatric:        Mood and Affect: Mood normal.     ED Results / Procedures / Treatments   Labs (all labs ordered are listed, but only abnormal results are displayed) Labs Reviewed - No data to display  EKG None  Radiology No results found.  Procedures .Epistaxis Management  Date/Time: 12/06/2021 7:37 AM  Performed by: 12/08/2021, MD Authorized by: Arby Barrette,  MD   Consent:    Consent obtained:  Verbal   Risks discussed:  Bleeding, infection, nasal injury and pain Anesthesia:    Anesthesia method:  None Procedure details:    Treatment site:  L anterior   Treatment method:  Nasal tampon   Treatment complexity:  Limited   Treatment episode: initial   Post-procedure details:    Assessment:  Bleeding stopped   Procedure completion:  Tolerated Comments:     Small rapid Rhino nasal tampon placed in the left nare without difficulty.  Moistened with Neo-Synephrine.  Was no clot present to clear.  Patient was not actively bleeding at time of placement but had trace fresh blood on the nasal septum.     Medications Ordered in ED Medications  phenylephrine (NEO-SYNEPHRINE) 0.5 % nasal solution 1 drop (1 drop Each Nare Given by Other 12/06/21 0730)     ED Course/ Medical Decision Making/ A&P                           Medical Decision Making Risk OTC drugs.   Patient has no history of nosebleed.  She has had recent sinus congestion and using topical nasal sprays.  He is not anticoagulated.  By time of arrival bleeding has stopped.  On exam no indication of clot in the posterior nasopharynx or oropharynx.  Nasal passage is clear and wisp of blood on the septum.  At this time I suspect anterior nosebleed from the septum.  Rapid Rhino tampon placed and Neo-Synephrine used to expand.  Patient tolerated this well.  I have counseled her on the nature of nosebleeds and potential for rebleed.  At this time, with patient otherwise well, normotensive, history and physical exam consistent with anterior bleeding, stable for discharge with follow-up.        Final Clinical Impression(s) / ED Diagnoses Final diagnoses:  Epistaxis  Left-sided epistaxis    Rx / DC Orders ED Discharge Orders     None         Arby Barrette, MD 12/06/21 [phone]

## 2021-12-06 NOTE — Discharge Instructions (Signed)
1.  You have a gauze packing in your nose.  Leave this in place for 24 to 48 hours. 2.  You have been given some Neo-Synephrine to use at home if needed.  If the packing becomes dry, you may put 1 to 2 drops on the packing sponge every 6 hours. 3.  The sponge may fall out on its own.  If there is no additional bleeding, follow-up with your doctor as needed.  If there is any bleeding, pinch your nose very firmly just below the bone and hold it for 10 to 15 minutes. 4.  If bleeding does not stop, return to the emergency department for recheck. 5.  Noses may rebleed despite packing.  Packing temporarily stops bleeding by pressure allowing the bleeding vessel time to heal and clot.  If you have brisk rebleeding and cannot get it to stop by compression as instructed in the emergency department, return for recheck.

## 2021-12-06 NOTE — ED Triage Notes (Signed)
Pt awoke with nose bleeding. EMS reports clots on scene but bleeding has slowed greatly. Pt is holding pressure. Pt reports recent sinus issues
